# Patient Record
Sex: Male | Born: 1975 | Race: White | Hispanic: No | Marital: Married | Smoking: Never smoker
Health system: Southern US, Community
[De-identification: ages and names within clinical notes are randomized; demographics above are authoritative.]

## PROBLEM LIST (undated history)

## (undated) DIAGNOSIS — T7840XA Allergy, unspecified, initial encounter: Secondary | ICD-10-CM

## (undated) HISTORY — DX: Allergy, unspecified, initial encounter: T78.40XA

---

## 2013-05-16 ENCOUNTER — Encounter: Payer: Self-pay | Admitting: Family Medicine

## 2013-05-16 ENCOUNTER — Ambulatory Visit (INDEPENDENT_AMBULATORY_CARE_PROVIDER_SITE_OTHER): Payer: PRIVATE HEALTH INSURANCE | Admitting: Family Medicine

## 2013-05-16 VITALS — BP 123/70 | Temp 98.6°F | Wt 166.0 lb

## 2013-05-16 DIAGNOSIS — J019 Acute sinusitis, unspecified: Secondary | ICD-10-CM

## 2013-05-16 MED ORDER — AZITHROMYCIN 250 MG PO TABS: ORAL_TABLET | ORAL | Status: DC

## 2013-05-16 NOTE — Progress Notes (Signed)
  Subjective:    Patient ID: Christopher Riggs, male    DOB: 10/24/1976, 37 y.o.   MRN: [id number]  HPI Patient went in with head congestion drainage coughing bringing up yellow phlegm denies high fever chills relates not feeling well no wheezing or difficulty breathing. PMH benign does not smoke.   Review of Systems Denies wheezing difficulty breathing chest pressure pain discomfort passing out. Denies rashes.    Objective:   Physical Exam TMs and L., T.-NL, neck is supple lungs are clear no crackles heart is regular pulse normal       Assessment & Plan:  Sinusitis-Zithromax as directed warning signs were discussed call us if progressive troubles

## 2013-05-16 NOTE — Patient Instructions (Signed)
Store brand Allegra, fexofenadine 180 mg one daily

## 2015-02-08 ENCOUNTER — Ambulatory Visit (INDEPENDENT_AMBULATORY_CARE_PROVIDER_SITE_OTHER): Payer: PRIVATE HEALTH INSURANCE | Admitting: Family Medicine

## 2015-02-08 ENCOUNTER — Encounter: Payer: Self-pay | Admitting: Family Medicine

## 2015-02-08 VITALS — BP 126/88 | Ht 69.0 in | Wt 169.0 lb

## 2015-02-08 DIAGNOSIS — M545 Low back pain, unspecified: Secondary | ICD-10-CM

## 2015-02-08 MED ORDER — ETODOLAC 400 MG PO TABS: 400.0000 mg | ORAL_TABLET | Freq: Two times a day (BID) | ORAL | Status: DC

## 2015-02-08 MED ORDER — CHLORZOXAZONE 500 MG PO TABS: 500.0000 mg | ORAL_TABLET | Freq: Four times a day (QID) | ORAL | Status: DC | PRN

## 2015-02-08 NOTE — Progress Notes (Signed)
   Subjective:    Patient ID: Christopher GerlachChristopher Riggs, male    DOB: 10-22-1976, 39 y.o.   MRN: [id number]  HPI Comments: Believes he pulled a muscle over the weekend when playing with his children.  Back Pain This is a new problem. The current episode started in the past 7 days. The problem occurs intermittently. The problem has been gradually worsening since onset. Pain location: left lower back. The pain is the same all the time. The symptoms are aggravated by coughing, bending, lying down and twisting. Stiffness is present in the morning. (Muscle spasms ) He has tried NSAIDs for the symptoms. The treatment provided mild relief.    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   No urinary symptoms no leg pain no chest pain    Objective:   Physical Exam  Alert no acute distress lungs clear heart rare rhythm left lumbar region tender to deep palpation negative straight leg raise spine nontender      Assessment & Plan:  Impression lumbar strain/spasm discussed plan anti-inflammatory medicine prescribed. Muscle spasm medicine prescribed symptomatically care discussed

## 2015-11-16 ENCOUNTER — Ambulatory Visit (INDEPENDENT_AMBULATORY_CARE_PROVIDER_SITE_OTHER): Payer: PRIVATE HEALTH INSURANCE | Admitting: Family Medicine

## 2015-11-16 ENCOUNTER — Encounter: Payer: Self-pay | Admitting: Family Medicine

## 2015-11-16 VITALS — Temp 98.1°F | Ht 69.0 in | Wt 165.0 lb

## 2015-11-16 DIAGNOSIS — J019 Acute sinusitis, unspecified: Secondary | ICD-10-CM

## 2015-11-16 DIAGNOSIS — K219 Gastro-esophageal reflux disease without esophagitis: Secondary | ICD-10-CM | POA: Insufficient documentation

## 2015-11-16 DIAGNOSIS — B9689 Other specified bacterial agents as the cause of diseases classified elsewhere: Secondary | ICD-10-CM

## 2015-11-16 MED ORDER — CEFPROZIL 250 MG/5ML PO SUSR: ORAL | Status: DC

## 2015-11-16 NOTE — Progress Notes (Signed)
   Subjective:    Patient ID: Christopher GerlachChristopher Riggs, male    DOB: [birth year], 39 y.o.   MRN: [id number]  Cough This is a new problem. The current episode started 1 to 4 weeks ago. Associated symptoms include headaches, nasal congestion, rhinorrhea and a sore throat. Pertinent negatives include no chest pain, ear pain, fever or wheezing. Associated symptoms comments: Sinus pressure. He has tried OTC cough suppressant (delysm, nyquil) for the symptoms.   Patient relates head congestion sinus pressure states he does better with swallowing liquids cause occasionally pills get stuck denies food getting stuck  Review of Systems  Constitutional: Negative for fever and activity change.  HENT: Positive for congestion, rhinorrhea and sore throat. Negative for ear pain.   Eyes: Negative for discharge.  Respiratory: Positive for cough. Negative for wheezing.   Cardiovascular: Negative for chest pain.  Neurological: Positive for headaches.       Objective:   Physical Exam  Constitutional: He appears well-developed.  HENT:  Head: Normocephalic.  Mouth/Throat: Oropharynx is clear and moist. No oropharyngeal exudate.  Neck: Normal range of motion.  Cardiovascular: Normal rate, regular rhythm and normal heart sounds.   No murmur heard. Pulmonary/Chest: Effort normal and breath sounds normal. He has no wheezes.  Lymphadenopathy:    He has no cervical adenopathy.  Neurological: He exhibits normal muscle tone.  Skin: Skin is warm and dry.  Nursing note and vitals reviewed.         Assessment & Plan:  Viral syndrome Secondary rhinosinusitis Antibiotics prescribed warning signs discussed follow-up if problems  I

## 2016-06-13 ENCOUNTER — Encounter: Payer: Self-pay | Admitting: Family Medicine

## 2016-06-13 ENCOUNTER — Ambulatory Visit (INDEPENDENT_AMBULATORY_CARE_PROVIDER_SITE_OTHER): Payer: PRIVATE HEALTH INSURANCE | Admitting: Family Medicine

## 2016-06-13 VITALS — BP 118/82 | Ht 69.0 in | Wt 177.2 lb

## 2016-06-13 DIAGNOSIS — M7711 Lateral epicondylitis, right elbow: Secondary | ICD-10-CM | POA: Diagnosis not present

## 2016-06-13 NOTE — Progress Notes (Signed)
   Subjective:    Patient ID: Christopher Riggs, male    DOB: [birth year], 40 y.o.   MRN: [id number]  HPI Patient arrives with c/o pain in right arm from his elbow to his wrist. 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 Never had this problem before Review of Systems see above    Objective:   Physical Exam On physical exam shoulder wrist normal moderate tenderness in the elbow consistent with tennis elbow neurologic grossly normal pulses normal color normal       Assessment & Plan:  Tennis elbow-pathophysiology stretches treatment was discussed in detail may use OTC Naprosyn 220 mg 2 in the morning 2 in the evening along with proper icing and proper brace if not improving over the next 4 weeks notify us  Detailed discussion regarding recent lab work that the patient did overall looks good slight elevation of bad cholesterol of the eating regular physical activity recommended repeat it again in one year

## 2016-09-30 ENCOUNTER — Encounter: Payer: Self-pay | Admitting: Family Medicine

## 2016-09-30 ENCOUNTER — Ambulatory Visit (INDEPENDENT_AMBULATORY_CARE_PROVIDER_SITE_OTHER): Payer: PRIVATE HEALTH INSURANCE | Admitting: Family Medicine

## 2016-09-30 VITALS — BP 118/84 | Temp 99.3°F | Ht 69.0 in | Wt 175.0 lb

## 2016-09-30 DIAGNOSIS — B9689 Other specified bacterial agents as the cause of diseases classified elsewhere: Secondary | ICD-10-CM | POA: Diagnosis not present

## 2016-09-30 DIAGNOSIS — J4521 Mild intermittent asthma with (acute) exacerbation: Secondary | ICD-10-CM | POA: Diagnosis not present

## 2016-09-30 DIAGNOSIS — J019 Acute sinusitis, unspecified: Secondary | ICD-10-CM | POA: Diagnosis not present

## 2016-09-30 DIAGNOSIS — J683 Other acute and subacute respiratory conditions due to chemicals, gases, fumes and vapors: Secondary | ICD-10-CM

## 2016-09-30 MED ORDER — ALBUTEROL SULFATE HFA 108 (90 BASE) MCG/ACT IN AERS: 2.0000 | INHALATION_SPRAY | Freq: Four times a day (QID) | RESPIRATORY_TRACT | 2 refills | Status: DC | PRN

## 2016-09-30 MED ORDER — PREDNISONE 20 MG PO TABS: ORAL_TABLET | ORAL | 0 refills | Status: DC

## 2016-09-30 MED ORDER — AMOXICILLIN-POT CLAVULANATE 875-125 MG PO TABS: 1.0000 | ORAL_TABLET | Freq: Two times a day (BID) | ORAL | 0 refills | Status: AC

## 2016-09-30 NOTE — Progress Notes (Signed)
   Subjective:    Patient ID: Christopher Riggs, male    DOB: March 10, 1976, 40 y.o.   MRN: [id number]  Sinusitis  This is a new problem. Episode onset: 2 weeks. Associated symptoms include congestion, coughing, headaches and a sore throat. Treatments tried: mucinex d.   Some olds in the family  Non dmoker   Notes wheeziness in the chest  Usually no sig allergis    Review of Systems  HENT: Positive for congestion and sore throat.   Respiratory: Positive for cough.   Neurological: Positive for headaches.       Objective:   Physical Exam  Alert, mild malaise. Hydration good Vitals stable. frontal/ maxillary tenderness evident positive nasal congestion. pharynx normal neck supple  lungs clear/no crackles . heart regular in rhythm Positive reactive airways      Assessment & Plan:  Impression sinusitis bronchitis with element of reactive airways. Plan antibiotics. Steroids. Albuterol 2 sprays 4 times a day. Proper use of inhaler discussed

## 2016-12-23 ENCOUNTER — Ambulatory Visit (INDEPENDENT_AMBULATORY_CARE_PROVIDER_SITE_OTHER): Payer: PRIVATE HEALTH INSURANCE | Admitting: Family Medicine

## 2016-12-23 ENCOUNTER — Encounter: Payer: Self-pay | Admitting: Family Medicine

## 2016-12-23 VITALS — BP 110/80 | Temp 98.2°F | Ht 69.0 in | Wt 171.0 lb

## 2016-12-23 DIAGNOSIS — J019 Acute sinusitis, unspecified: Secondary | ICD-10-CM | POA: Diagnosis not present

## 2016-12-23 DIAGNOSIS — B349 Viral infection, unspecified: Secondary | ICD-10-CM | POA: Diagnosis not present

## 2016-12-23 DIAGNOSIS — B9689 Other specified bacterial agents as the cause of diseases classified elsewhere: Secondary | ICD-10-CM | POA: Diagnosis not present

## 2016-12-23 MED ORDER — DOXYCYCLINE HYCLATE 100 MG PO CAPS: 100.0000 mg | ORAL_CAPSULE | Freq: Two times a day (BID) | ORAL | 0 refills | Status: DC

## 2016-12-23 NOTE — Progress Notes (Signed)
   Subjective:    Patient ID: Christopher GerlachChristopher Riggs, male    DOB: [birth year], 41 y.o.   MRN: [id number]  Sinusitis  This is a new problem. The current episode started in the past 7 days. The problem is unchanged. There has been no fever. Associated symptoms include congestion, coughing and headaches. Pertinent negatives include no ear pain. Treatments tried: Mucinex DM. The treatment provided no relief.   Patient does have history of reactive airway although not severe recently Patient does not smoke Relates some head congestion drainage coughing chest congestion sinus pressure   Review of Systems  Constitutional: Negative for activity change and fever.  HENT: Positive for congestion and rhinorrhea. Negative for ear pain.   Eyes: Negative for discharge.  Respiratory: Positive for cough. Negative for wheezing.   Cardiovascular: Negative for chest pain.  Neurological: Positive for headaches.       Objective:   Physical Exam  Constitutional: He appears well-developed.  HENT:  Head: Normocephalic.  Mouth/Throat: Oropharynx is clear and moist. No oropharyngeal exudate.  Neck: Normal range of motion.  Cardiovascular: Normal rate, regular rhythm and normal heart sounds.   No murmur heard. Pulmonary/Chest: Effort normal and breath sounds normal. He has no wheezes.  Lymphadenopathy:    He has no cervical adenopathy.  Neurological: He exhibits normal muscle tone.  Skin: Skin is warm and dry.  Nursing note and vitals reviewed.         Assessment & Plan:  Viral syndrome Secondary rhinosinusitis Antibiotic prescribed warning signs discussed follow-up if ongoing troubles

## 2017-10-08 ENCOUNTER — Encounter: Payer: Self-pay | Admitting: Family Medicine

## 2017-10-08 ENCOUNTER — Ambulatory Visit (INDEPENDENT_AMBULATORY_CARE_PROVIDER_SITE_OTHER): Payer: PRIVATE HEALTH INSURANCE | Admitting: Family Medicine

## 2017-10-08 VITALS — BP 130/94 | Temp 98.8°F | Ht 69.0 in | Wt 177.0 lb

## 2017-10-08 DIAGNOSIS — B349 Viral infection, unspecified: Secondary | ICD-10-CM

## 2017-10-08 DIAGNOSIS — J189 Pneumonia, unspecified organism: Secondary | ICD-10-CM | POA: Diagnosis not present

## 2017-10-08 MED ORDER — AZITHROMYCIN 250 MG PO TABS: ORAL_TABLET | ORAL | 0 refills | Status: DC

## 2017-10-08 MED ORDER — DOXYCYCLINE HYCLATE 100 MG PO TABS: 100.0000 mg | ORAL_TABLET | Freq: Two times a day (BID) | ORAL | 0 refills | Status: DC

## 2017-10-08 NOTE — Progress Notes (Signed)
   Subjective:    Patient ID: Christopher Riggs, male    DOB: 02/27/1976, 41 y.o.   MRN: [id number]  Cough  This is a new problem. The current episode started 1 to 4 weeks ago. The problem has been gradually worsening. The cough is productive of sputum. Associated symptoms include headaches, rhinorrhea and a sore throat. Pertinent negatives include no chest pain, ear pain, fever or wheezing. Treatments tried: Mucinex DM    Patient states no other concerns this visit.  PMH benign he states it started over the past couple weeks a lot of congestion drainage coughing  Review of Systems  Constitutional: Negative for activity change and fever.  HENT: Positive for congestion, rhinorrhea and sore throat. Negative for ear pain.   Eyes: Negative for discharge.  Respiratory: Positive for cough. Negative for wheezing.   Cardiovascular: Negative for chest pain.  Neurological: Positive for headaches.       Objective:   Physical Exam  Constitutional: He appears well-developed.  HENT:  Head: Normocephalic.  Mouth/Throat: Oropharynx is clear and moist. No oropharyngeal exudate.  Neck: Normal range of motion.  Cardiovascular: Normal rate, regular rhythm and normal heart sounds.   No murmur heard. Pulmonary/Chest: Effort normal. He has no wheezes.  He has significant congestion noted in both lungs more at the bases not in respiratory distress  Lymphadenopathy:    He has no cervical adenopathy.  Neurological: He exhibits normal muscle tone.  Skin: Skin is warm and dry.  Nursing note and vitals reviewed.         Assessment & Plan:  Viral syndrome Walking pneumonia X-ray lab work not indicated Warning signs were discussed in detail Secondary rhinosinusitis Antibiotics prescribed warning signs discussed follow-up of ongoing troubles

## 2019-09-06 ENCOUNTER — Telehealth: Payer: Self-pay | Admitting: Family Medicine

## 2019-09-06 DIAGNOSIS — Z0000 Encounter for general adult medical examination without abnormal findings: Secondary | ICD-10-CM

## 2019-09-06 NOTE — Telephone Encounter (Signed)
Lipid, liver, met 7, CBC

## 2019-09-06 NOTE — Telephone Encounter (Signed)
No lab orders completed from our office in 3 years. Please advise. Thank you

## 2019-09-06 NOTE — Telephone Encounter (Signed)
Orders put in and pt notified.  

## 2019-09-06 NOTE — Telephone Encounter (Signed)
Scheduled for a physical in October and would like orders put in for labs done at [facility] to leave a msg when lab orders are in.

## 2019-10-05 ENCOUNTER — Encounter: Payer: PRIVATE HEALTH INSURANCE | Admitting: Family Medicine

## 2019-11-01 ENCOUNTER — Encounter: Payer: Self-pay | Admitting: Family Medicine

## 2019-11-01 ENCOUNTER — Other Ambulatory Visit: Payer: Self-pay

## 2019-11-01 ENCOUNTER — Ambulatory Visit (INDEPENDENT_AMBULATORY_CARE_PROVIDER_SITE_OTHER): Payer: PRIVATE HEALTH INSURANCE | Admitting: Family Medicine

## 2019-11-01 VITALS — BP 124/84 | Temp 97.8°F | Ht 67.5 in | Wt 166.8 lb

## 2019-11-01 DIAGNOSIS — Z0000 Encounter for general adult medical examination without abnormal findings: Secondary | ICD-10-CM

## 2019-11-01 NOTE — Progress Notes (Signed)
   Subjective:    Patient ID: Lemoyne Nestor, male    DOB: August 21, 1976, 43 y.o.   MRN: [id number]  HPI The patient comes in today for a wellness visit.  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 states overall energy level doing well trying to be healthy with his eating habits Patient does not smoke or drink patient denies being depressed A review of their health history was completed.  A review of medications was also completed.  Any needed refills; not on any meds  Eating habits: health conscious  Falls/  MVA accidents in past few months: none  Regular exercise: not really  Specialist pt sees on regular basis: none  Preventative health issues were discussed.   Additional concerns: none   Review of Systems  Constitutional: Negative for activity change, appetite change and fever.  HENT: Negative for congestion and rhinorrhea.   Eyes: Negative for discharge.  Respiratory: Negative for cough and wheezing.   Cardiovascular: Negative for chest pain.  Gastrointestinal: Negative for abdominal pain, blood in stool and vomiting.  Genitourinary: Negative for difficulty urinating and frequency.  Musculoskeletal: Negative for neck pain.  Skin: Negative for rash.  Allergic/Immunologic: Negative for environmental allergies and food allergies.  Neurological: Negative for weakness and headaches.  Psychiatric/Behavioral: Negative for agitation.       Objective:   Physical Exam Constitutional:      Appearance: He is well-developed.  HENT:     Head: Normocephalic and atraumatic.     Right Ear: External ear normal.     Left Ear: External ear normal.     Nose: Nose normal.  Eyes:     Pupils: Pupils are equal, round, and reactive to light.  Neck:     Musculoskeletal: Normal range of motion and neck supple.     Thyroid: No thyromegaly.  Cardiovascular:     Rate and Rhythm: Normal rate and regular rhythm.     Heart  sounds: Normal heart sounds. No murmur.  Pulmonary:     Effort: Pulmonary effort is normal. No respiratory distress.     Breath sounds: Normal breath sounds. No wheezing.  Abdominal:     General: Bowel sounds are normal. There is no distension.     Palpations: Abdomen is soft. There is no mass.     Tenderness: There is no abdominal tenderness.  Genitourinary:    Penis: Normal.   Musculoskeletal: Normal range of motion.  Lymphadenopathy:     Cervical: No cervical adenopathy.  Skin:    General: Skin is warm and dry.     Findings: No erythema.  Neurological:     Mental Status: He is alert.     Motor: No abnormal muscle tone.  Psychiatric:        Behavior: Behavior normal.        Judgment: Judgment normal.           Assessment & Plan:  Adult wellness-complete.wellness physical was conducted today. Importance of diet and exercise were discussed in detail.  In addition to this a discussion regarding safety was also covered. We also reviewed over immunizations and gave recommendations regarding current immunization needed for age.  In addition to this additional areas were also touched on including: Preventative health exams needed:  Colonoscopy not indicated He will do lab work later today follow-up if any ongoing troubles otherwise wellness yearly Patient declines flu shot  Patient was advised yearly wellness exam

## 2019-11-02 LAB — LIPID PANEL
Chol/HDL Ratio: 4.7 ratio (ref 0.0–5.0)
Cholesterol, Total: 208 mg/dL — ABNORMAL HIGH (ref 100–199)
HDL: 44 mg/dL (ref >39)
LDL Chol Calc (NIH): 135 mg/dL — ABNORMAL HIGH (ref 0–99)
Triglycerides: 161 mg/dL — ABNORMAL HIGH (ref 0–149)
VLDL Cholesterol Cal: 29 mg/dL (ref 5–40)

## 2019-11-02 LAB — BASIC METABOLIC PANEL
BUN/Creatinine Ratio: 9 (ref 9–20)
BUN: 9 mg/dL (ref 6–24)
CO2: 24 mmol/L (ref 20–29)
Calcium: 9.9 mg/dL (ref 8.7–10.2)
Chloride: 100 mmol/L (ref 96–106)
Creatinine, Ser: 1.04 mg/dL (ref 0.76–1.27)
GFR calc Af Amer: 103 mL/min/{1.73_m2} (ref >59)
GFR calc non Af Amer: 89 mL/min/{1.73_m2} (ref >59)
Glucose: 83 mg/dL (ref 65–99)
Potassium: 4.2 mmol/L (ref 3.5–5.2)
Sodium: 139 mmol/L (ref 134–144)

## 2019-11-02 LAB — CBC WITH DIFFERENTIAL/PLATELET
Basophils Absolute: 0.1 10*3/uL (ref 0.0–0.2)
Basos: 1 %
EOS (ABSOLUTE): 0.1 10*3/uL (ref 0.0–0.4)
Eos: 1 %
Hematocrit: 49.2 % (ref 37.5–51.0)
Hemoglobin: 17 g/dL (ref 13.0–17.7)
Immature Grans (Abs): 0 10*3/uL (ref 0.0–0.1)
Immature Granulocytes: 1 %
Lymphocytes Absolute: 3.4 10*3/uL — ABNORMAL HIGH (ref 0.7–3.1)
Lymphs: 39 %
MCH: 30.3 pg (ref 26.6–33.0)
MCHC: 34.6 g/dL (ref 31.5–35.7)
MCV: 88 fL (ref 79–97)
Monocytes Absolute: 0.7 10*3/uL (ref 0.1–0.9)
Monocytes: 8 %
Neutrophils Absolute: 4.6 10*3/uL (ref 1.4–7.0)
Neutrophils: 50 %
Platelets: 329 10*3/uL (ref 150–450)
RBC: 5.61 x10E6/uL (ref 4.14–5.80)
RDW: 13.7 % (ref 11.6–15.4)
WBC: 8.8 10*3/uL (ref 3.4–10.8)

## 2019-11-02 LAB — HEPATIC FUNCTION PANEL
ALT: 26 IU/L (ref 0–44)
AST: 23 IU/L (ref 0–40)
Albumin: 4.9 g/dL (ref 4.0–5.0)
Alkaline Phosphatase: 80 IU/L (ref 39–117)
Bilirubin Total: 0.6 mg/dL (ref 0.0–1.2)
Bilirubin, Direct: 0.15 mg/dL (ref 0.00–0.40)
Total Protein: 7.6 g/dL (ref 6.0–8.5)

## 2020-09-03 ENCOUNTER — Telehealth (INDEPENDENT_AMBULATORY_CARE_PROVIDER_SITE_OTHER): Payer: No Typology Code available for payment source | Admitting: Family Medicine

## 2020-09-03 ENCOUNTER — Other Ambulatory Visit: Payer: Self-pay

## 2020-09-03 ENCOUNTER — Ambulatory Visit (HOSPITAL_COMMUNITY)
Admission: RE | Admit: 2020-09-03 | Discharge: 2020-09-03 | Disposition: A | Discharge status: Home / Self Care | Payer: No Typology Code available for payment source | Source: Ambulatory Visit | Attending: Family Medicine | Admitting: Family Medicine

## 2020-09-03 VITALS — Ht 69.0 in | Wt 155.0 lb

## 2020-09-03 DIAGNOSIS — R05 Cough: Secondary | ICD-10-CM | POA: Insufficient documentation

## 2020-09-03 DIAGNOSIS — R059 Cough, unspecified: Secondary | ICD-10-CM

## 2020-09-03 DIAGNOSIS — U071 COVID-19: Secondary | ICD-10-CM | POA: Diagnosis not present

## 2020-09-03 NOTE — Progress Notes (Signed)
   Subjective:    Patient ID: Christopher Riggs, male    DOB: [birth year], 44 y.o.   MRN: [id number]  HPIcovid test positve last Tuesday per pt. Symptoms started Monday before taking test. Started as fatigue and high fever on the first day. Then fatigue and sinus pressure for several days, then 3 days ago started running high temp again. Having a mild cough, diarrhea and sinus pressure now. Taking mucinex and tylenol.   Virtual Visit via Video Note  I connected with Christopher Riggs on 09/03/20 at 11:30 AM EDT by a video enabled telemedicine application and verified that I am speaking with the correct person using two identifiers.  Location: Patient: home   Provider: office   I discussed the limitations of evaluation and management by telemedicine and the availability of in person appointments. The patient expressed understanding and agreed to proceed.  History of Present Illness:    Observations/Objective:   Assessment and Plan:   Follow Up Instructions:    I discussed the assessment and treatment plan with the patient. The patient was provided an opportunity to ask questions and all were answered. The patient agreed with the plan and demonstrated an understanding of the instructions.   The patient was advised to call back or seek an in-person evaluation if the symptoms worsen or if the condition fails to improve as anticipated.  I provided 20 minutes of non-face-to-face time during this encounter.   After discussion on the phone it was determined that the patient needed to be seen in person and was brought by the office lungs were clear respiratory rate normal no respiratory distress    Review of Systems     Objective:   Physical Exam  Please see above No respiratory distress O2 saturation good     Assessment & Plan:  Chest x-ray ordered Positive Covid Should do well but warning signs were discussed in detail If severe shortness of breath or other issues  immediately go to ER/urgent care Call us if any questions

## 2020-09-07 ENCOUNTER — Ambulatory Visit: Payer: No Typology Code available for payment source | Admitting: Family Medicine

## 2020-09-07 ENCOUNTER — Other Ambulatory Visit: Payer: Self-pay

## 2020-09-07 ENCOUNTER — Ambulatory Visit (INDEPENDENT_AMBULATORY_CARE_PROVIDER_SITE_OTHER): Payer: No Typology Code available for payment source | Admitting: Family Medicine

## 2020-09-07 VITALS — HR 96 | Temp 98.7°F | Resp 16

## 2020-09-07 DIAGNOSIS — U071 COVID-19: Secondary | ICD-10-CM | POA: Diagnosis not present

## 2020-09-07 NOTE — Progress Notes (Signed)
Patient ID: Christopher Riggs, male    DOB: 07/08/1976, 44 y.o.   MRN: [id number]   Chief Complaint  Patient presents with   Covid Positive   Subjective:    HPI Pt with covid testing positive. Seen 1 wk ago and supposed to f/u on Wednesday and missed appt. Having high fevers, and going to 100-101F. Then has kep going to 105 quickly and persisting for several days.  At noon yesterday got high, when had temp at 100F then kept it high. Taking ibuprofen earlier than letting it so high.   Today feeling much better, sleep better.  Small cough. Small amt nausea and vomiting.  High fever vomited.  Had some diarrhea, but got better.  None in last 2 days. otc meds- vit C, vit d, zinc,  No cough syrup.  Tylenol/ibuprofen.    Medical History Christopher Riggs has a past medical history of Allergy.   Outpatient Encounter Medications as of 09/07/2020  Medication Sig   albuterol (VENTOLIN HFA) 108 (90 Base) MCG/ACT inhaler Inhale 2 puffs into the lungs every 6 (six) hours as needed for wheezing or shortness of breath.   No facility-administered encounter medications on file as of 09/07/2020.     Review of Systems  Constitutional: Positive for fever. Negative for chills.  HENT: Negative for congestion, ear pain, rhinorrhea, sinus pressure, sinus pain, sneezing and sore throat.   Eyes: Negative for pain, discharge and itching.  Respiratory: Positive for cough.   Gastrointestinal: Positive for diarrhea (resolved), nausea and vomiting (when having high fever, resolved).  Skin: Negative for rash.  Neurological: Negative for headaches.     Vitals Pulse 96    Temp 98.7 F (37.1 C) (Temporal)    Resp 16    SpO2 98%   Objective:   Physical Exam Vitals and nursing note reviewed.  Constitutional:      General: He is not in acute distress.    Appearance: Normal appearance. He is not ill-appearing.  HENT:     Head: Normocephalic.     Nose: Nose normal. No congestion.     Mouth/Throat:       Mouth: Mucous membranes are moist.     Pharynx: No oropharyngeal exudate.  Eyes:     Extraocular Movements: Extraocular movements intact.     Conjunctiva/sclera: Conjunctivae normal.     Pupils: Pupils are equal, round, and reactive to light.  Cardiovascular:     Rate and Rhythm: Normal rate and regular rhythm.     Pulses: Normal pulses.     Heart sounds: Normal heart sounds. No murmur heard.   Pulmonary:     Effort: Pulmonary effort is normal.     Breath sounds: Normal breath sounds. No wheezing, rhonchi or rales.  Musculoskeletal:        General: Normal range of motion.     Right lower leg: No edema.     Left lower leg: No edema.  Skin:    General: Skin is warm and dry.     Findings: No rash.  Neurological:     General: No focal deficit present.     Mental Status: He is alert and oriented to person, place, and time.     Cranial Nerves: No cranial nerve deficit.  Psychiatric:        Mood and Affect: Mood normal.        Behavior: Behavior normal.        Thought Content: Thought content normal.        Judgment: Judgment  normal.      Assessment and Plan   1. COVID-19 virus infection - albuterol (VENTOLIN HFA) 108 (90 Base) MCG/ACT inhaler; Inhale 2 puffs into the lungs every 6 (six) hours as needed for wheezing or shortness of breath.  Dispense: 18 g; Refill: 0    Feeling much better today, taking ibuprofen now and helping with his fever.  Staying hydrated.  Cont vit d, c, and zinc. increase fluids, otc cough syrup as needed.  Gave inhaler to use prn for coughing. Call or rto if worsening fevers, coughing, or short of breath.  F/u prn.

## 2020-09-09 ENCOUNTER — Encounter: Payer: Self-pay | Admitting: Family Medicine

## 2020-09-10 ENCOUNTER — Telehealth: Payer: Self-pay | Admitting: *Deleted

## 2020-09-10 ENCOUNTER — Encounter: Payer: Self-pay | Admitting: Family Medicine

## 2020-09-10 MED ORDER — AMOXICILLIN-POT CLAVULANATE 875-125 MG PO TABS: 1.0000 | ORAL_TABLET | Freq: Two times a day (BID) | ORAL | 0 refills | Status: AC

## 2020-09-10 MED ORDER — ALBUTEROL SULFATE HFA 108 (90 BASE) MCG/ACT IN AERS: 2.0000 | INHALATION_SPRAY | Freq: Four times a day (QID) | RESPIRATORY_TRACT | 0 refills | Status: DC | PRN

## 2020-09-10 NOTE — Telephone Encounter (Signed)
Patient notified

## 2020-09-10 NOTE — Telephone Encounter (Signed)
Augmentin 875 mg, 1 taken twice daily, 7 days worth-#14-if any ongoing troubles please follow-up

## 2020-09-10 NOTE — Telephone Encounter (Signed)
Prescription sent electronically to pharmacy  Left message to return call 

## 2020-09-10 NOTE — Telephone Encounter (Signed)
Patient diagnosed with Covid and was seen by Dr Ladona Ridgel on Friday 09/07/20 because he still was feeling pretty bad and was told to do symptomatic care(they were not happy with this advise and him not getting antibiotic) They had the ability to get some leftover Augmentin and he started that on Friday after they left and he is feeling much better today but is going to be out of medication today and would like a prescription to make sure he can take a full course.

## 2020-09-27 ENCOUNTER — Telehealth: Payer: Self-pay

## 2020-09-27 DIAGNOSIS — Z0000 Encounter for general adult medical examination without abnormal findings: Secondary | ICD-10-CM

## 2020-09-27 DIAGNOSIS — Z79899 Other long term (current) drug therapy: Secondary | ICD-10-CM

## 2020-09-27 DIAGNOSIS — Z13220 Encounter for screening for lipoid disorders: Secondary | ICD-10-CM

## 2020-09-27 NOTE — Telephone Encounter (Signed)
Patient has physical in December and needing labs done 

## 2020-09-27 NOTE — Telephone Encounter (Signed)
Last labs completed 11/01/2019 CBC, BMET, HEPATIC and LIPID. Please advise. Thank you

## 2020-09-30 ENCOUNTER — Other Ambulatory Visit: Payer: Self-pay | Admitting: Family Medicine

## 2020-09-30 NOTE — Telephone Encounter (Signed)
Unless he is having any specific problems At this time I would recommend Lipid, liver, metabolic 7

## 2020-10-01 NOTE — Telephone Encounter (Signed)
Lab orders placed and pt is aware 

## 2020-11-13 ENCOUNTER — Other Ambulatory Visit: Payer: Self-pay | Admitting: Family Medicine

## 2020-11-13 MED ORDER — PERMETHRIN 5 % EX CREA: TOPICAL_CREAM | CUTANEOUS | 1 refills | Status: DC

## 2020-11-20 ENCOUNTER — Encounter: Payer: Self-pay | Admitting: Family Medicine

## 2020-11-20 ENCOUNTER — Ambulatory Visit (INDEPENDENT_AMBULATORY_CARE_PROVIDER_SITE_OTHER): Payer: No Typology Code available for payment source | Admitting: Family Medicine

## 2020-11-20 ENCOUNTER — Other Ambulatory Visit: Payer: Self-pay

## 2020-11-20 VITALS — BP 122/82 | HR 87 | Temp 97.8°F | Ht 67.5 in | Wt 167.6 lb

## 2020-11-20 DIAGNOSIS — Z0000 Encounter for general adult medical examination without abnormal findings: Secondary | ICD-10-CM

## 2020-11-20 NOTE — Progress Notes (Signed)
   Subjective:    Patient ID: Christopher Riggs, male    DOB: 15-Aug-1976, 44 y.o.   MRN: [id number]  HPI  The patient comes in today for a wellness visit.  Patient for wellness Does not smoke does not drink Tries to eat healthy Stays physically active and exercises on a regular basis Keeps good mental health denies being depressed No falls no injuries  A review of their health history was completed.  A review of medications was also completed.  Any needed refills; none  Eating habits: eating good  Falls/  MVA accidents in past few months: none  Regular exercise: yes- 3 times a week  Specialist pt sees on regular basis: none  Preventative health issues were discussed.   Additional concerns: none   Review of Systems  Constitutional: Negative for activity change, appetite change and fever.  HENT: Negative for congestion and rhinorrhea.   Eyes: Negative for discharge.  Respiratory: Negative for cough and wheezing.   Cardiovascular: Negative for chest pain.  Gastrointestinal: Negative for abdominal pain, blood in stool and vomiting.  Genitourinary: Negative for difficulty urinating and frequency.  Musculoskeletal: Negative for neck pain.  Skin: Negative for rash.  Allergic/Immunologic: Negative for environmental allergies and food allergies.  Neurological: Negative for weakness and headaches.  Psychiatric/Behavioral: Negative for agitation.       Objective:   Physical Exam Constitutional:      Appearance: He is well-developed.  HENT:     Head: Normocephalic and atraumatic.     Right Ear: External ear normal.     Left Ear: External ear normal.     Nose: Nose normal.  Eyes:     Pupils: Pupils are equal, round, and reactive to light.  Neck:     Thyroid: No thyromegaly.  Cardiovascular:     Rate and Rhythm: Normal rate and regular rhythm.     Heart sounds: Normal heart sounds. No murmur heard.   Pulmonary:     Effort: Pulmonary effort is normal. No  respiratory distress.     Breath sounds: Normal breath sounds. No wheezing.  Abdominal:     General: Bowel sounds are normal. There is no distension.     Palpations: Abdomen is soft. There is no mass.     Tenderness: There is no abdominal tenderness.  Genitourinary:    Penis: Normal.   Musculoskeletal:        General: Normal range of motion.     Cervical back: Normal range of motion and neck supple.  Lymphadenopathy:     Cervical: No cervical adenopathy.  Skin:    General: Skin is warm and dry.     Findings: No erythema.  Neurological:     Mental Status: He is alert.     Motor: No abnormal muscle tone.  Psychiatric:        Behavior: Behavior normal.        Judgment: Judgment normal.      Prostate exam not indicated     Assessment & Plan:  1. Routine general medical examination at a health care facility Adult wellness-complete.wellness physical was conducted today. Importance of diet and exercise were discussed in detail.  In addition to this a discussion regarding safety was also covered. We also reviewed over immunizations and gave recommendations regarding current immunization needed for age.  In addition to this additional areas were also touched on including: Preventative health exams needed:  Colonoscopy not indicated currently  Patient was advised yearly wellness exam

## 2020-11-21 ENCOUNTER — Encounter: Payer: Self-pay | Admitting: Family Medicine

## 2020-11-21 LAB — HEPATIC FUNCTION PANEL
ALT: 24 IU/L (ref 0–44)
AST: 20 IU/L (ref 0–40)
Albumin: 4.5 g/dL (ref 4.0–5.0)
Alkaline Phosphatase: 78 IU/L (ref 44–121)
Bilirubin Total: 0.3 mg/dL (ref 0.0–1.2)
Bilirubin, Direct: 0.11 mg/dL (ref 0.00–0.40)
Total Protein: 7.1 g/dL (ref 6.0–8.5)

## 2020-11-21 LAB — BASIC METABOLIC PANEL
BUN/Creatinine Ratio: 11 (ref 9–20)
BUN: 9 mg/dL (ref 6–24)
CO2: 23 mmol/L (ref 20–29)
Calcium: 9.5 mg/dL (ref 8.7–10.2)
Chloride: 102 mmol/L (ref 96–106)
Creatinine, Ser: 0.85 mg/dL (ref 0.76–1.27)
GFR calc Af Amer: 124 mL/min/{1.73_m2} (ref >59)
GFR calc non Af Amer: 107 mL/min/{1.73_m2} (ref >59)
Glucose: 82 mg/dL (ref 65–99)
Potassium: 4.2 mmol/L (ref 3.5–5.2)
Sodium: 140 mmol/L (ref 134–144)

## 2020-11-21 LAB — LIPID PANEL
Chol/HDL Ratio: 5.7 ratio — ABNORMAL HIGH (ref 0.0–5.0)
Cholesterol, Total: 210 mg/dL — ABNORMAL HIGH (ref 100–199)
HDL: 37 mg/dL — ABNORMAL LOW (ref >39)
LDL Chol Calc (NIH): 115 mg/dL — ABNORMAL HIGH (ref 0–99)
Triglycerides: 334 mg/dL — ABNORMAL HIGH (ref 0–149)
VLDL Cholesterol Cal: 58 mg/dL — ABNORMAL HIGH (ref 5–40)

## 2020-11-28 ENCOUNTER — Other Ambulatory Visit: Payer: Self-pay | Admitting: Family Medicine

## 2021-09-19 IMAGING — DX DG CHEST 2V
2 series · 2 of 2 positions shown · non-contrast
Comparison: None.

CLINICAL DATA: Cough, [id number] positive.

EXAM:
CHEST - 2 VIEW

[chest pa]
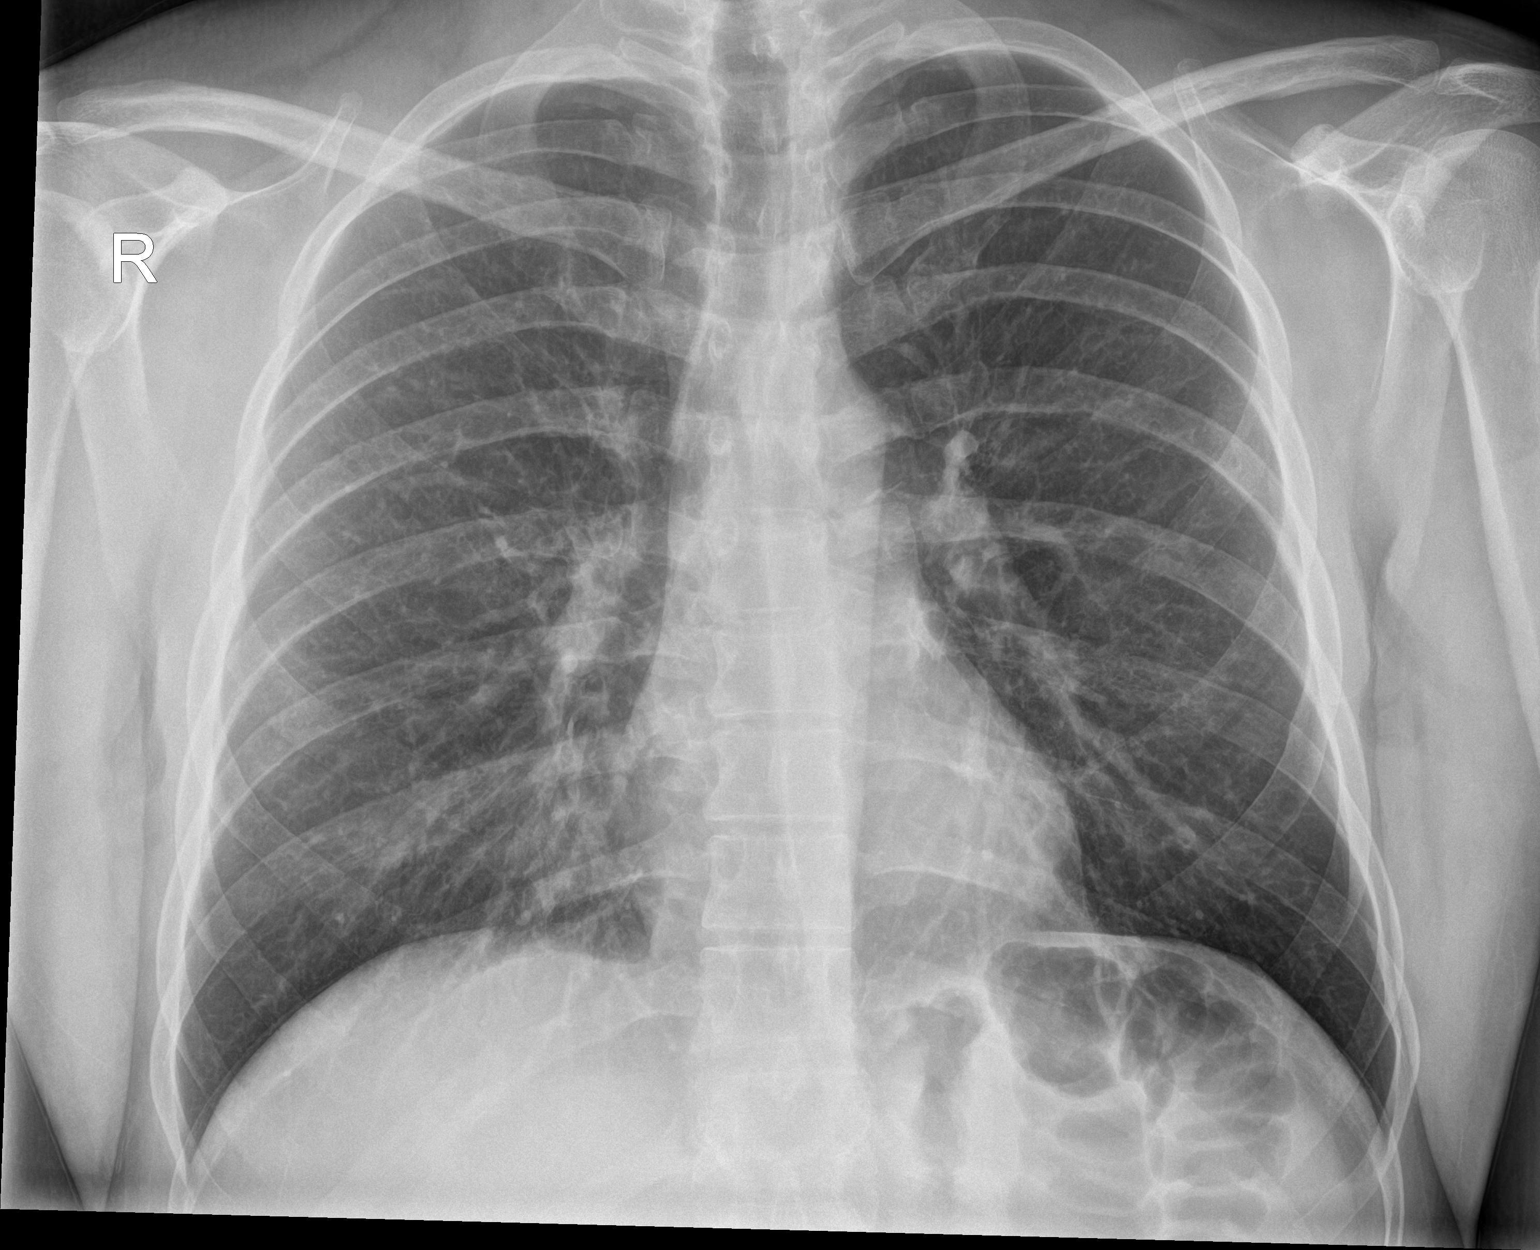

[chest lat]
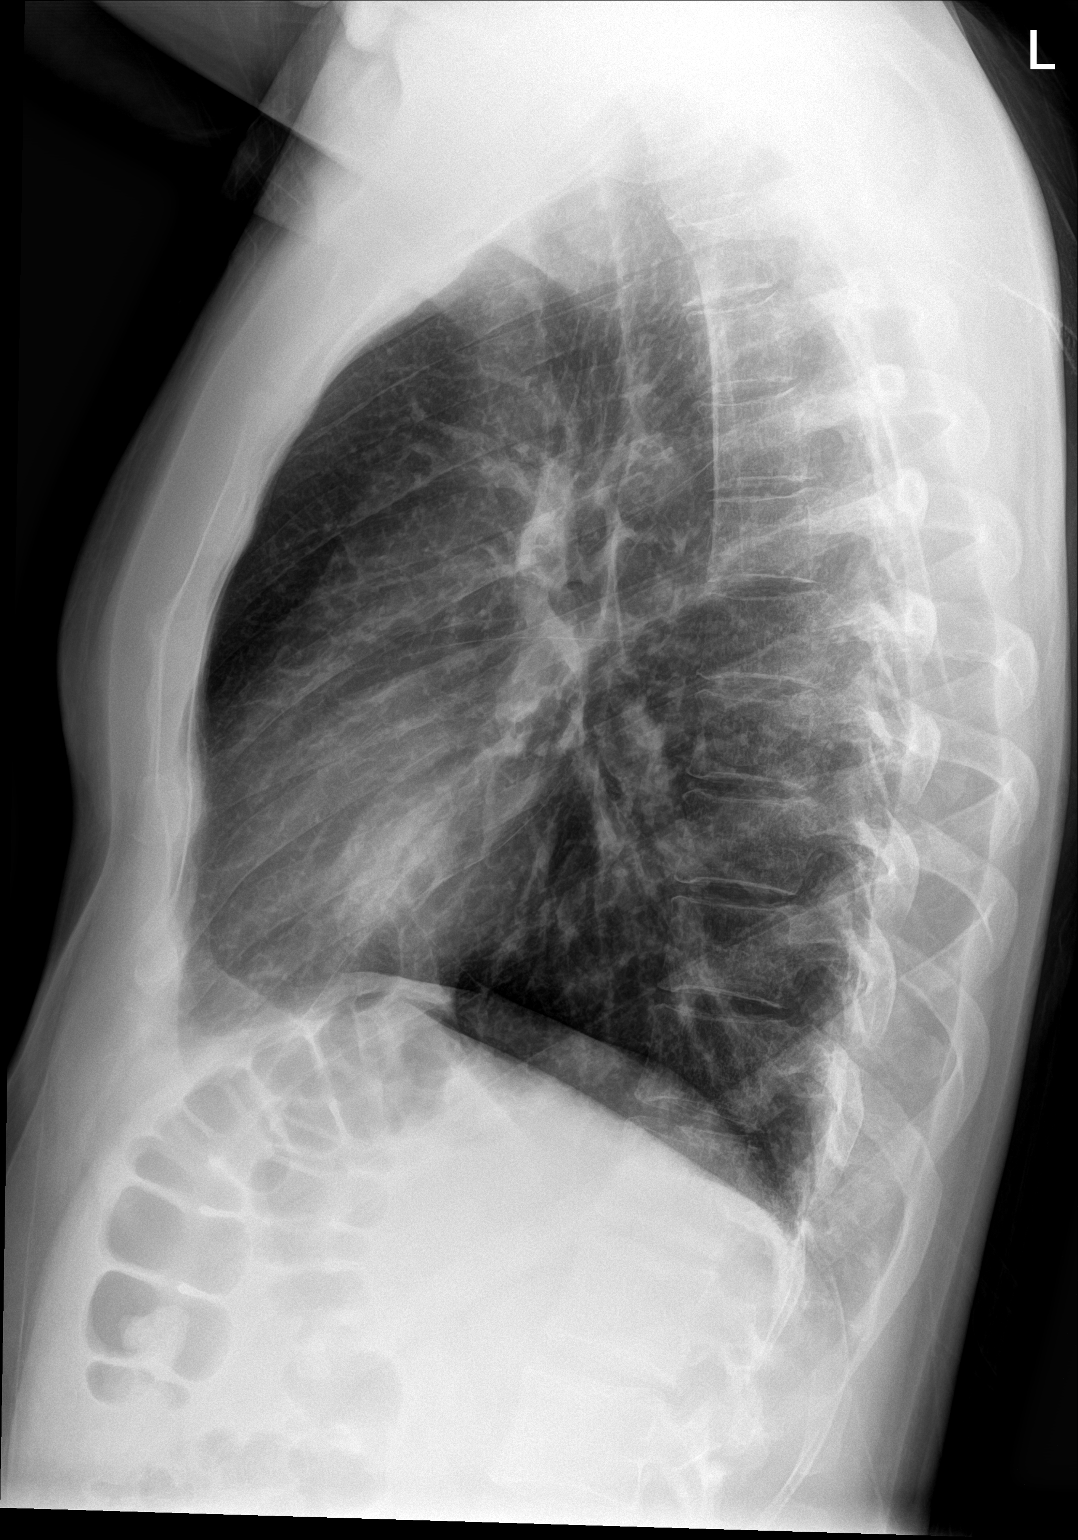

[2 of 2 positions shown; findings below may reference images not displayed]

FINDINGS: The heart size and mediastinal contours are within normal limits.
Both lungs are clear. The visualized skeletal structures are
unremarkable.
IMPRESSION: No active cardiopulmonary disease.

## 2021-10-14 ENCOUNTER — Encounter: Payer: Self-pay | Admitting: Family Medicine

## 2021-10-14 DIAGNOSIS — Z79899 Other long term (current) drug therapy: Secondary | ICD-10-CM

## 2021-10-14 DIAGNOSIS — E785 Hyperlipidemia, unspecified: Secondary | ICD-10-CM

## 2021-10-14 DIAGNOSIS — R5383 Other fatigue: Secondary | ICD-10-CM

## 2021-10-14 DIAGNOSIS — Z0000 Encounter for general adult medical examination without abnormal findings: Secondary | ICD-10-CM

## 2021-10-14 NOTE — Telephone Encounter (Signed)
Nurses Recommend lipid, liver, metabolic 7, CBC, testosterone  Diagnosis hyperlipidemia Wellness Other fatigue

## 2021-10-17 LAB — CBC WITH DIFFERENTIAL/PLATELET
Basophils Absolute: 0 10*3/uL (ref 0.0–0.2)
Basos: 1 %
EOS (ABSOLUTE): 0 10*3/uL (ref 0.0–0.4)
Eos: 0 %
Hematocrit: 48.6 % (ref 37.5–51.0)
Hemoglobin: 16.2 g/dL (ref 13.0–17.7)
Immature Grans (Abs): 0 10*3/uL (ref 0.0–0.1)
Immature Granulocytes: 0 %
Lymphocytes Absolute: 2.2 10*3/uL (ref 0.7–3.1)
Lymphs: 27 %
MCH: 29.9 pg (ref 26.6–33.0)
MCHC: 33.3 g/dL (ref 31.5–35.7)
MCV: 90 fL (ref 79–97)
Monocytes Absolute: 0.4 10*3/uL (ref 0.1–0.9)
Monocytes: 5 %
Neutrophils Absolute: 5.6 10*3/uL (ref 1.4–7.0)
Neutrophils: 67 %
Platelets: 315 10*3/uL (ref 150–450)
RBC: 5.42 x10E6/uL (ref 4.14–5.80)
RDW: 13.2 % (ref 11.6–15.4)
WBC: 8.3 10*3/uL (ref 3.4–10.8)

## 2021-10-17 LAB — BASIC METABOLIC PANEL
BUN/Creatinine Ratio: 11 (ref 9–20)
BUN: 10 mg/dL (ref 6–24)
CO2: 25 mmol/L (ref 20–29)
Calcium: 9.5 mg/dL (ref 8.7–10.2)
Chloride: 103 mmol/L (ref 96–106)
Creatinine, Ser: 0.88 mg/dL (ref 0.76–1.27)
Glucose: 110 mg/dL — ABNORMAL HIGH (ref 70–99)
Potassium: 4.1 mmol/L (ref 3.5–5.2)
Sodium: 142 mmol/L (ref 134–144)
eGFR: 109 mL/min/{1.73_m2} (ref >59)

## 2021-10-17 LAB — HEPATIC FUNCTION PANEL
ALT: 13 IU/L (ref 0–44)
AST: 12 IU/L (ref 0–40)
Albumin: 4.4 g/dL (ref 4.0–5.0)
Alkaline Phosphatase: 73 IU/L (ref 44–121)
Bilirubin Total: 0.4 mg/dL (ref 0.0–1.2)
Bilirubin, Direct: 0.12 mg/dL (ref 0.00–0.40)
Total Protein: 6.5 g/dL (ref 6.0–8.5)

## 2021-10-17 LAB — LIPID PANEL
Chol/HDL Ratio: 4.1 ratio (ref 0.0–5.0)
Cholesterol, Total: 162 mg/dL (ref 100–199)
HDL: 40 mg/dL (ref >39)
LDL Chol Calc (NIH): 93 mg/dL (ref 0–99)
Triglycerides: 165 mg/dL — ABNORMAL HIGH (ref 0–149)
VLDL Cholesterol Cal: 29 mg/dL (ref 5–40)

## 2021-10-17 LAB — TESTOSTERONE: Testosterone: 357 ng/dL (ref 264–916)

## 2021-10-23 ENCOUNTER — Other Ambulatory Visit: Payer: Self-pay

## 2021-10-23 ENCOUNTER — Ambulatory Visit (INDEPENDENT_AMBULATORY_CARE_PROVIDER_SITE_OTHER): Payer: No Typology Code available for payment source | Admitting: Family Medicine

## 2021-10-23 VITALS — BP 120/74 | Temp 97.2°F | Ht 66.5 in | Wt 154.2 lb

## 2021-10-23 DIAGNOSIS — Z0000 Encounter for general adult medical examination without abnormal findings: Secondary | ICD-10-CM

## 2021-10-23 NOTE — Patient Instructions (Signed)
Results for orders placed or performed in visit on 10/14/21  Lipid Profile  Result Value Ref Range   Cholesterol, Total 162 100 - 199 mg/dL   Triglycerides 165 (H) 0 - 149 mg/dL   HDL 40 >39 mg/dL   VLDL Cholesterol Cal 29 5 - 40 mg/dL   LDL Chol Calc (NIH) 93 0 - 99 mg/dL   Chol/HDL Ratio 4.1 0.0 - 5.0 ratio  Hepatic function panel  Result Value Ref Range   Total Protein 6.5 6.0 - 8.5 g/dL   Albumin 4.4 4.0 - 5.0 g/dL   Bilirubin Total 0.4 0.0 - 1.2 mg/dL   Bilirubin, Direct 0.12 0.00 - 0.40 mg/dL   Alkaline Phosphatase 73 44 - 121 IU/L   AST 12 0 - 40 IU/L   ALT 13 0 - 44 IU/L  Basic Metabolic Panel (BMET)  Result Value Ref Range   Glucose 110 (H) 70 - 99 mg/dL   BUN 10 6 - 24 mg/dL   Creatinine, Ser 0.88 0.76 - 1.27 mg/dL   eGFR 109 >59 mL/min/1.73   BUN/Creatinine Ratio 11 9 - 20   Sodium 142 134 - 144 mmol/L   Potassium 4.1 3.5 - 5.2 mmol/L   Chloride 103 96 - 106 mmol/L   CO2 25 20 - 29 mmol/L   Calcium 9.5 8.7 - 10.2 mg/dL  CBC with Differential  Result Value Ref Range   WBC 8.3 3.4 - 10.8 x10E3/uL   RBC 5.42 4.14 - 5.80 x10E6/uL   Hemoglobin 16.2 13.0 - 17.7 g/dL   Hematocrit 48.6 37.5 - 51.0 %   MCV 90 79 - 97 fL   MCH 29.9 26.6 - 33.0 pg   MCHC 33.3 31.5 - 35.7 g/dL   RDW 13.2 11.6 - 15.4 %   Platelets 315 150 - 450 x10E3/uL   Neutrophils 67 Not Estab. %   Lymphs 27 Not Estab. %   Monocytes 5 Not Estab. %   Eos 0 Not Estab. %   Basos 1 Not Estab. %   Neutrophils Absolute 5.6 1.4 - 7.0 x10E3/uL   Lymphocytes Absolute 2.2 0.7 - 3.1 x10E3/uL   Monocytes Absolute 0.4 0.1 - 0.9 x10E3/uL   EOS (ABSOLUTE) 0.0 0.0 - 0.4 x10E3/uL   Basophils Absolute 0.0 0.0 - 0.2 x10E3/uL   Immature Granulocytes 0 Not Estab. %   Immature Grans (Abs) 0.0 0.0 - 0.1 x10E3/uL  Testosterone  Result Value Ref Range   Testosterone 357 264 - 916 ng/dL

## 2021-10-23 NOTE — Progress Notes (Signed)
   Subjective:    Patient ID: Christopher Riggs, male    DOB: Jan 23, 1976, 45 y.o.   MRN: [id number]  HPI The patient comes in today for a wellness visit.  He is doing a good job taking care of himself and eating healthy does not smoke does not drink denies being depressed tries to be safe  A review of their health history was completed.  A review of medications was also completed.  Any needed refills; not on any meds currently   Eating habits: healthy eating   Falls/  MVA accidents in past few months: none  Regular exercise: yes  Specialist pt sees on regular basis: none  Preventative health issues were discussed.   Additional concerns: had vasectomy last week     Review of Systems     Objective:   Physical Exam General-in no acute distress Eyes-no discharge Lungs-respiratory rate normal, CTA CV-no murmurs,RRR Extremities skin warm dry no edema Neuro grossly normal Behavior normal, alert  Labs reviewed in detail.  Patient did state he ate some before get blood drawn that explains his elevated glucose otherwise cholesterol kidney function liver function look good Patient defers on flu shot     Assessment & Plan:   Adult wellness-complete.wellness physical was conducted today. Importance of diet and exercise were discussed in detail.  In addition to this a discussion regarding safety was also covered. We also reviewed over immunizations and gave recommendations regarding current immunization needed for age.  In addition to this additional areas were also touched on including: Preventative health exams needed:  Colonoscopy not indicated  Patient was advised yearly wellness exam

## 2022-01-02 ENCOUNTER — Encounter: Payer: Self-pay | Admitting: Family Medicine

## 2022-01-02 NOTE — Telephone Encounter (Signed)
Patient informed needed to be seen per Dr. Nicki Reaper. Appointment given for 9:20a tomorrow. Verbalized understanding.

## 2022-01-02 NOTE — Telephone Encounter (Signed)
I would recommend being seen.  I could work him in late this afternoon or tomorrow morning.  Hard to know if this cyst is something that will get better on its own or if it needs an antibiotic or may need referral to a specialist.  Cannot tell via telemetry/electronic message

## 2022-01-03 ENCOUNTER — Encounter: Payer: Self-pay | Admitting: Family Medicine

## 2022-01-03 ENCOUNTER — Ambulatory Visit: Payer: No Typology Code available for payment source | Admitting: Family Medicine

## 2022-01-03 ENCOUNTER — Other Ambulatory Visit: Payer: Self-pay

## 2022-01-03 VITALS — BP 117/81 | HR 72 | Temp 98.6°F | Ht 66.5 in | Wt 159.0 lb

## 2022-01-03 DIAGNOSIS — H00036 Abscess of eyelid left eye, unspecified eyelid: Secondary | ICD-10-CM

## 2022-01-03 MED ORDER — AMOXICILLIN-POT CLAVULANATE 875-125 MG PO TABS: 1.0000 | ORAL_TABLET | Freq: Two times a day (BID) | ORAL | 0 refills | Status: DC

## 2022-01-03 NOTE — Patient Instructions (Signed)
Please remember to do the warm compresses for 10 minutes at a time every 2 hours while awake  Take the antibiotic twice a day with a snack for the next 7 days  You should be much better by early next week if having troubles or problems please call me thanks-Dr. Lorin Picket

## 2022-01-03 NOTE — Progress Notes (Signed)
° °  Subjective:    Patient ID: Christopher Riggs, male    DOB: [birth year], 46 y.o.   MRN: [id number]  HPI  Stye on L eye , soreness for 24 hrs Left superior eyelid tender Present for several days Developed into what appeared to be a white bump which went away this morning but still left with tenderness No blurred vision Review of Systems     Objective:   Physical Exam Pupil normal slight erythema in the sclera but no sign of iritis Has swollen tender top eyelid Early cellulitis with probable stye       Assessment & Plan:  Early cellulitis Preorbital Warm compresses frequently Antibiotics over the next week If not dramatically better by Monday notify us If progressively worrisome or problematic to notify us

## 2022-02-10 ENCOUNTER — Encounter: Payer: Self-pay | Admitting: Family Medicine

## 2022-02-26 ENCOUNTER — Other Ambulatory Visit: Payer: Self-pay

## 2022-02-26 ENCOUNTER — Ambulatory Visit: Payer: No Typology Code available for payment source | Admitting: Family Medicine

## 2022-02-26 VITALS — BP 124/80 | HR 88 | Temp 98.4°F | Ht 66.5 in | Wt 163.4 lb

## 2022-02-26 DIAGNOSIS — R1013 Epigastric pain: Secondary | ICD-10-CM | POA: Diagnosis not present

## 2022-02-26 MED ORDER — PANTOPRAZOLE SODIUM 40 MG PO TBEC: 40.0000 mg | DELAYED_RELEASE_TABLET | Freq: Every day | ORAL | 3 refills | Status: DC

## 2022-02-26 NOTE — Progress Notes (Addendum)
? ?Subjective:  ?Patient ID: Christopher Riggs, male    DOB: 19-Oct-1976  Age: 46 y.o. MRN: [id number] ? ?CC: ?Chief Complaint  ?Patient presents with  ? stomach issues  ?  When eats a normal amount feels like he is full.  Has happened twice a week for the past month.  He said one day he ate 2 BBQ sandwiches and he felt full.  ? ? ?HPI: ? ?46 year old male presents for evaluation of the above. ? ?Patient reports that over the past month he has had intermittent difficulty with feeling full after he eats what he considers to be a smaller portion of food.  Patient reports this occurs approximately 1-2 times a week.  He states that most recently he ate 2 barbecue sandwiches and felt extremely full and bloated.  He states that this is not normal for him.  Denies weight loss.  Denies abdominal pain.  No hematochezia or melena.  He states that when this occurs he does feel like he needs to belch.  No reports of chest pain or shortness of breath.  He has no other complaints or concerns at this time. ? ? ?Social Hx   ?Social History  ? ?Socioeconomic History  ? Marital status: Married  ?  Spouse name: Not on file  ? Number of children: Not on file  ? Years of education: Not on file  ? Highest education level: Not on file  ?Occupational History  ? Not on file  ?Tobacco Use  ? Smoking status: Never  ? Smokeless tobacco: Never  ?Substance and Sexual Activity  ? Alcohol use: Not on file  ? Drug use: Not on file  ? Sexual activity: Not on file  ?Other Topics Concern  ? Not on file  ?Social History Narrative  ? Not on file  ? ?Social Determinants of Health  ? ?Financial Resource Strain: Not on file  ?Food Insecurity: Not on file  ?Transportation Needs: Not on file  ?Physical Activity: Not on file  ?Stress: Not on file  ?Social Connections: Not on file  ? ? ?Review of Systems  ?Constitutional:  Negative for appetite change and unexpected weight change.  ?Gastrointestinal:   ?     No abdominal pain.  No nausea or vomiting.  Early  satiety.  ? ? ?Objective:  ?BP 124/80   Pulse 88   Temp 98.4 ?F (36.9 ?C) (Oral)   Ht 5' 6.5" (1.689 m)   Wt 163 lb 6.4 oz (74.1 kg)   SpO2 96%   BMI 25.98 kg/m?  ? ?BP/Weight 02/26/2022 01/03/2022 10/23/2021  ?Systolic BP [id number]  ?Diastolic BP 80 81 74  ?Wt. (Lbs) 163.4 159 154.2  ?BMI 25.98 25.28 24.52  ? ? ?Physical Exam ?Vitals and nursing note reviewed.  ?Constitutional:   ?   General: He is not in acute distress. ?   Appearance: Normal appearance. He is not ill-appearing.  ?HENT:  ?   Head: Normocephalic and atraumatic.  ?Eyes:  ?   General:     ?   Right eye: No discharge.     ?   Left eye: No discharge.  ?   Conjunctiva/sclera: Conjunctivae normal.  ?Cardiovascular:  ?   Rate and Rhythm: Normal rate and regular rhythm.  ?   Heart sounds: No murmur heard. ?Pulmonary:  ?   Effort: Pulmonary effort is normal.  ?   Breath sounds: Normal breath sounds. No wheezing or rales.  ?Abdominal:  ?   General: There is no  distension.  ?   Palpations: Abdomen is soft. There is no mass.  ?   Tenderness: There is no abdominal tenderness.  ?Neurological:  ?   Mental Status: He is alert.  ?Psychiatric:     ?   Mood and Affect: Mood normal.     ?   Behavior: Behavior normal.  ? ? ?Lab Results  ?Component Value Date  ? WBC 8.3 10/16/2021  ? HGB 16.2 10/16/2021  ? HCT 48.6 10/16/2021  ? PLT 315 10/16/2021  ? GLUCOSE 110 (H) 10/16/2021  ? CHOL 162 10/16/2021  ? TRIG 165 (H) 10/16/2021  ? HDL 40 10/16/2021  ? Blossom 93 10/16/2021  ? ALT 13 10/16/2021  ? AST 12 10/16/2021  ? NA 142 10/16/2021  ? K 4.1 10/16/2021  ? CL 103 10/16/2021  ? CREATININE 0.88 10/16/2021  ? BUN 10 10/16/2021  ? CO2 25 10/16/2021  ? ? ? ?Assessment & Plan:  ? ?Problem List Items Addressed This Visit   ? ?  ? Other  ? Dyspepsia - Primary  ?  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  Patient presenting with early satiety.  Testing for H. pylori (per guideline based recommendations).  After testing sample has been given, patient can start PPI.  He is to follow-up  in approximately 1 month.  No red flags at this time. ?  ?  ? Relevant Orders  ? H. pylori antigen, stool  ? ?Meds ordered this encounter  ?Medications  ? pantoprazole (PROTONIX) 40 MG tablet  ?  Sig: Take 1 tablet (40 mg total) by mouth daily.  ?  Dispense:  30 tablet  ?  Refill:  3  ? ? ?Follow-up:  Return in about 1 month (around 03/29/2022). ? ?Thersa Salt DO ?[place] ? ?

## 2022-02-26 NOTE — Assessment & Plan Note (Signed)
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  Patient presenting with early satiety.  Testing for H. pylori (per guideline based recommendations).  After testing sample has been given, patient can start PPI.  He is to follow-up in approximately 1 month.  No red flags at this time. ?

## 2022-02-26 NOTE — Patient Instructions (Signed)
Go to the lab regarding H pylori testing. ? ?Medication as prescribed (after H pylori sample given). ? ?Follow up in 1 month. ? ?Take care ? ?Dr. Adriana Simas  ?

## 2022-03-01 LAB — H. PYLORI ANTIGEN, STOOL: H pylori Ag, Stl: NEGATIVE

## 2022-03-31 ENCOUNTER — Ambulatory Visit: Payer: No Typology Code available for payment source | Admitting: Family Medicine

## 2022-03-31 DIAGNOSIS — R1013 Epigastric pain: Secondary | ICD-10-CM

## 2022-03-31 NOTE — Assessment & Plan Note (Addendum)
Patient is doing well.  Symptoms have resolved. Continue Protonix (has refills). Plan to continue and reassess at follow up. ?

## 2022-03-31 NOTE — Patient Instructions (Signed)
Continue the protonix. ? ?Follow up with Dr. Lorin Picket in 3 months. ? ?Take care ? ?Dr. Adriana Simas  ?

## 2022-03-31 NOTE — Progress Notes (Signed)
? ?Subjective:  ?Patient ID: Christopher Riggs, male    DOB: 04-20-1976  Age: 46 y.o. MRN: [id number] ? ?CC: ?Chief Complaint  ?Patient presents with  ? Follow-up  ?  Stomach issues- doing better on protonix  ? ? ?HPI: ? ?46 year old male presents for follow-up regarding dyspepsia. ? ?Patient previously presented with early satiety.  H. pylori testing was negative.  Patient was placed on Protonix.  He states that he is doing well and his symptoms have resolved.  He is compliant with Protonix.  He states that he is back to his baseline. ? ?Patient Active Problem List  ? Diagnosis Date Noted  ? Dyspepsia 02/26/2022  ? Gastroesophageal reflux disease without esophagitis 11/16/2015  ? ? ?Social Hx   ?Social History  ? ?Socioeconomic History  ? Marital status: Married  ?  Spouse name: Not on file  ? Number of children: Not on file  ? Years of education: Not on file  ? Highest education level: Not on file  ?Occupational History  ? Not on file  ?Tobacco Use  ? Smoking status: Never  ? Smokeless tobacco: Never  ?Substance and Sexual Activity  ? Alcohol use: Not on file  ? Drug use: Not on file  ? Sexual activity: Not on file  ?Other Topics Concern  ? Not on file  ?Social History Narrative  ? Not on file  ? ?Social Determinants of Health  ? ?Financial Resource Strain: Not on file  ?Food Insecurity: Not on file  ?Transportation Needs: Not on file  ?Physical Activity: Not on file  ?Stress: Not on file  ?Social Connections: Not on file  ? ? ?Review of Systems  ?Constitutional: Negative.   ?Gastrointestinal: Negative.   ? ?Objective:  ?BP 126/84   Pulse 62   Ht 5' 6.5" (1.689 m)   Wt 160 lb 12.8 oz (72.9 kg)   SpO2 99%   BMI 25.56 kg/m?  ? ? ?  03/31/2022  ?  1:08 PM 02/26/2022  ?  1:14 PM 01/03/2022  ?  9:20 AM  ?BP/Weight  ?Systolic BP 126 124 117  ?Diastolic BP 84 80 81  ?Wt. (Lbs) 160.8 163.4 159  ?BMI 25.56 kg/m2 25.98 kg/m2 25.28 kg/m2  ? ? ?Physical Exam ?Vitals and nursing note reviewed.  ?Constitutional:   ?    General: He is not in acute distress. ?   Appearance: Normal appearance. He is not ill-appearing.  ?HENT:  ?   Head: Normocephalic and atraumatic.  ?Cardiovascular:  ?   Rate and Rhythm: Normal rate and regular rhythm.  ?Pulmonary:  ?   Effort: Pulmonary effort is normal.  ?   Breath sounds: Normal breath sounds.  ?Abdominal:  ?   General: There is no distension.  ?   Palpations: Abdomen is soft.  ?   Tenderness: There is no abdominal tenderness.  ?Neurological:  ?   Mental Status: He is alert.  ? ? ?Lab Results  ?Component Value Date  ? WBC 8.3 10/16/2021  ? HGB 16.2 10/16/2021  ? HCT 48.6 10/16/2021  ? PLT 315 10/16/2021  ? GLUCOSE 110 (H) 10/16/2021  ? CHOL 162 10/16/2021  ? TRIG 165 (H) 10/16/2021  ? HDL 40 10/16/2021  ? LDLCALC 93 10/16/2021  ? ALT 13 10/16/2021  ? AST 12 10/16/2021  ? NA 142 10/16/2021  ? K 4.1 10/16/2021  ? CL 103 10/16/2021  ? CREATININE 0.88 10/16/2021  ? BUN 10 10/16/2021  ? CO2 25 10/16/2021  ? ? ? ?Assessment &  Plan:  ? ?Problem List Items Addressed This Visit   ? ?  ? Other  ? Dyspepsia  ?  Patient is doing well.  Symptoms have resolved. Continue Protonix (has refills). Plan to continue and reassess at follow up. ? ?  ?  ? ?Follow-up:  Return in about 3 months (around 06/30/2022). ? ?Everlene Other DO ?[facility] ? ?

## 2022-10-07 ENCOUNTER — Telehealth: Payer: Self-pay | Admitting: Family Medicine

## 2022-10-07 NOTE — Telephone Encounter (Signed)
Patient has physical on 11/27 and needing labs done

## 2022-10-07 NOTE — Telephone Encounter (Signed)
Recommend lipid, liver, metabolic 7, N2T, HIV antibody, hep C antibody  Reason-wellness, fasting hyperglycemia, screening HIV, screening hep C per CDC guidelines If patient desires to have testosterone level checked this year you may add this but last year it was perfectly fine so we want recommend checking it unless he truly wants to check it

## 2022-10-07 NOTE — Telephone Encounter (Signed)
Last labs 10/2021 testosterone, cbc, bmp, liver, lipids

## 2022-10-08 ENCOUNTER — Other Ambulatory Visit: Payer: Self-pay

## 2022-10-08 DIAGNOSIS — E785 Hyperlipidemia, unspecified: Secondary | ICD-10-CM

## 2022-10-08 DIAGNOSIS — Z0000 Encounter for general adult medical examination without abnormal findings: Secondary | ICD-10-CM

## 2022-10-08 DIAGNOSIS — Z79899 Other long term (current) drug therapy: Secondary | ICD-10-CM

## 2022-10-08 NOTE — Telephone Encounter (Signed)
Patient has been made aware per lab orders. 

## 2022-11-10 ENCOUNTER — Encounter: Payer: Self-pay | Admitting: Family Medicine

## 2022-11-10 ENCOUNTER — Ambulatory Visit (INDEPENDENT_AMBULATORY_CARE_PROVIDER_SITE_OTHER): Payer: No Typology Code available for payment source | Admitting: Family Medicine

## 2022-11-10 VITALS — BP 112/68 | HR 102 | Temp 98.4°F | Ht 66.5 in | Wt 155.0 lb

## 2022-11-10 DIAGNOSIS — N529 Male erectile dysfunction, unspecified: Secondary | ICD-10-CM

## 2022-11-10 DIAGNOSIS — Z0000 Encounter for general adult medical examination without abnormal findings: Secondary | ICD-10-CM

## 2022-11-10 MED ORDER — SILDENAFIL CITRATE 20 MG PO TABS: ORAL_TABLET | ORAL | 2 refills | Status: AC

## 2022-11-10 NOTE — Progress Notes (Signed)
   Subjective:    Patient ID: Christopher Riggs, male    DOB: Jun 30, 1976, 46 y.o.   MRN: [id number]  HPI The patient comes in today for a wellness visit.  Patient does not smoke Rarely drinks Denies being depressed Stress levels are going well Sleeping well Practices safety  A review of their health history was completed.  A review of medications was also completed.  Any needed refills; none  Eating habits: good diet  Falls/  MVA accidents in past few months: no  Regular exercise: yes , walking  Specialist pt sees on regular basis: no  Preventative health issues were discussed.   Additional concerns: ED  Patient relates mild erectile dysfunction and has difficult time obtaining a firmer erection.  Sometimes it is just mildly erect other times has difficulty obtaining erection.  His relationship was going through stress but is doing much much better now.  We did discuss how this medication can help with erectile dysfunction but I would recommend starting off at a low dosage and gradually moving up.  Patient was warned about the potential for side effects including erection that could last greater than 4 hours and the need to go to emergency department if that does happen  Review of Systems     Objective:   Physical Exam General-in no acute distress Eyes-no discharge Lungs-respiratory rate normal, CTA CV-no murmurs,RRR Extremities skin warm dry no edema Neuro grossly normal Behavior normal, alert No murmurs abdomen soft no masses extremities no edema       Assessment & Plan:   Patient to do lab work Adult wellness-complete.wellness physical was conducted today. Importance of diet and exercise were discussed in detail.  Importance of stress reduction and healthy living were discussed.  In addition to this a discussion regarding safety was also covered.  We also reviewed over immunizations and gave recommendations regarding current immunization needed for age.    In addition to this additional areas were also touched on including: Preventative health exams needed:  Colonoscopy recommended at age 11 if his insurance company covers that  Patient was advised yearly wellness exam  Patient with mild erectile dysfunction Sildenafil 20 mg 1 tablet-up to 3 tablets 1 hour before relations see discussion above

## 2022-11-11 LAB — BASIC METABOLIC PANEL
BUN/Creatinine Ratio: 10 (ref 9–20)
BUN: 11 mg/dL (ref 6–24)
CO2: 23 mmol/L (ref 20–29)
Calcium: 9.8 mg/dL (ref 8.7–10.2)
Chloride: 102 mmol/L (ref 96–106)
Creatinine, Ser: 1.11 mg/dL (ref 0.76–1.27)
Glucose: 80 mg/dL (ref 70–99)
Potassium: 4.3 mmol/L (ref 3.5–5.2)
Sodium: 142 mmol/L (ref 134–144)
eGFR: 84 mL/min/{1.73_m2} (ref >59)

## 2022-11-11 LAB — LIPID PANEL
Chol/HDL Ratio: 4.9 ratio (ref 0.0–5.0)
Cholesterol, Total: 192 mg/dL (ref 100–199)
HDL: 39 mg/dL — ABNORMAL LOW (ref >39)
LDL Chol Calc (NIH): 112 mg/dL — ABNORMAL HIGH (ref 0–99)
Triglycerides: 235 mg/dL — ABNORMAL HIGH (ref 0–149)
VLDL Cholesterol Cal: 41 mg/dL — ABNORMAL HIGH (ref 5–40)

## 2022-11-11 LAB — HEPATIC FUNCTION PANEL
ALT: 18 IU/L (ref 0–44)
AST: 17 IU/L (ref 0–40)
Albumin: 4.7 g/dL (ref 4.1–5.1)
Alkaline Phosphatase: 84 IU/L (ref 44–121)
Bilirubin Total: 0.3 mg/dL (ref 0.0–1.2)
Bilirubin, Direct: <0.1 mg/dL (ref 0.00–0.40)
Total Protein: 7 g/dL (ref 6.0–8.5)

## 2022-11-11 LAB — HIV ANTIBODY (ROUTINE TESTING W REFLEX): HIV Screen 4th Generation wRfx: NONREACTIVE

## 2022-11-11 LAB — HEMOGLOBIN A1C
Est. average glucose Bld gHb Est-mCnc: 100 mg/dL
Hgb A1c MFr Bld: 5.1 % (ref 4.8–5.6)

## 2022-11-11 LAB — HEPATITIS C ANTIBODY: Hep C Virus Ab: NONREACTIVE

## 2023-11-17 ENCOUNTER — Ambulatory Visit (INDEPENDENT_AMBULATORY_CARE_PROVIDER_SITE_OTHER): Payer: No Typology Code available for payment source | Admitting: Family Medicine

## 2023-11-17 VITALS — BP 130/80 | HR 55 | Temp 98.4°F | Ht 67.91 in | Wt 162.6 lb

## 2023-11-17 DIAGNOSIS — M25561 Pain in right knee: Secondary | ICD-10-CM

## 2023-11-17 DIAGNOSIS — Z0001 Encounter for general adult medical examination with abnormal findings: Secondary | ICD-10-CM

## 2023-11-17 DIAGNOSIS — E785 Hyperlipidemia, unspecified: Secondary | ICD-10-CM

## 2023-11-17 DIAGNOSIS — Z0000 Encounter for general adult medical examination without abnormal findings: Secondary | ICD-10-CM

## 2023-11-17 NOTE — Progress Notes (Signed)
Subjective:    Patient ID: Christopher Riggs, male    DOB: June 14, 1976, 47 y.o.   MRN: [id number]  HPI The patient comes in today for a wellness visit.  Discussed the use of AI scribe software for clinical note transcription with the patient, who gave verbal consent to proceed.  History of Present Illness   The patient, who maintains an active lifestyle and healthy diet, presented with a primary complaint of persistent knee discomfort. This discomfort has been present for approximately six months and has gradually worsened over time. The patient denies any specific injury to the knee, indicating that the discomfort began insidiously. The discomfort is described as an aching sensation, primarily located on the inside of the knee. The patient reports that the discomfort is most noticeable at night, often preventing him from returning to sleep. However, the discomfort is not described as a stabbing pain, but more of an ache.  During the day, the discomfort is less noticeable unless the patient has been in a stationary position for an extended period, such as sitting or kneeling. Upon movement, the discomfort reportedly subsides. The patient has been managing the discomfort with over-the-counter analgesics such as Tylenol and ibuprofen, primarily at night when the discomfort is most severe.  The patient leads an active lifestyle, engaging in activities such as hiking and kayaking. He also maintains a healthy diet, growing and consuming his own vegetables. The patient denies any smoking history and reports only occasional alcohol use. The patient also reports good sleep habits, typically getting seven to eight hours of sleep per night.  The patient has a history of knee discomfort, having reported similar symptoms in the left knee approximately two to three years ago. However, the discomfort in the left knee gradually resolved on its own. The patient denies any family history of early heart disease or  cancers. He is not currently taking any daily medications, opting instead to maintain health through diet and exercise.       A review of their health history was completed.  A review of medications was also completed.  Any needed refills; N/A  Eating habits: Good  Falls/  MVA accidents in past few months: No  Regular exercise: Yes, walking  Specialist pt sees on regular basis: No  Preventative health issues were discussed.   Additional concerns: Right knee pain, ache feeling    Review of Systems     Objective:   Physical Exam General-in no acute distress Eyes-no discharge Lungs-respiratory rate normal, CTA CV-no murmurs,RRR Extremities skin warm dry no edema Neuro grossly normal Behavior normal, alert Physical Exam   HEENT: External auditory canals clear, tympanic membranes normal. Oral cavity without lesions, normal dentition. NECK: Without lumps or bumps. CHEST: Lungs clear to auscultation bilaterally. CARDIOVASCULAR: Heart sounds normal. ABDOMEN: Soft, non-tender on palpation. MUSCULOSKELETAL: Left knee without pain on palpation or manipulation.     Ligaments and cartilage appear intact on physical exam       Assessment & Plan:  1. Well adult exam Adult wellness-complete.wellness physical was conducted today. Importance of diet and exercise were discussed in detail.  Importance of stress reduction and healthy living were discussed.  In addition to this a discussion regarding safety was also covered.  We also reviewed over immunizations and gave recommendations regarding current immunization needed for age.   In addition to this additional areas were also touched on including: Preventative health exams needed:  Colonoscopy I have encouraged patient to go ahead and get a colonoscopy he  will check with his insurance company to let us know if it is covered Alternatives would be stool checking for blood on a yearly basis if insurance does not cover  colonoscopy  Patient was advised yearly wellness exam  - CBC with Differential - CMP14+EGFR  2. Right knee pain, unspecified chronicity Gentle range of motion, Tylenol as needed, ibuprofen when necessary, if progressive troubles over the next few weeks notify us and we will set up with orthopedics - DG Knee Complete 4 Views Right  3. Hyperlipidemia, unspecified hyperlipidemia type Check lipid profile - Lipid Panel

## 2023-11-18 LAB — CBC WITH DIFFERENTIAL/PLATELET
Basophils Absolute: 0.1 10*3/uL (ref 0.0–0.2)
Basos: 1 %
EOS (ABSOLUTE): 0.1 10*3/uL (ref 0.0–0.4)
Eos: 1 %
Hematocrit: 52.5 % — ABNORMAL HIGH (ref 37.5–51.0)
Hemoglobin: 17.7 g/dL (ref 13.0–17.7)
Immature Grans (Abs): 0 10*3/uL (ref 0.0–0.1)
Immature Granulocytes: 0 %
Lymphocytes Absolute: 2.8 10*3/uL (ref 0.7–3.1)
Lymphs: 46 %
MCH: 30.6 pg (ref 26.6–33.0)
MCHC: 33.7 g/dL (ref 31.5–35.7)
MCV: 91 fL (ref 79–97)
Monocytes Absolute: 0.6 10*3/uL (ref 0.1–0.9)
Monocytes: 9 %
Neutrophils Absolute: 2.7 10*3/uL (ref 1.4–7.0)
Neutrophils: 43 %
Platelets: 303 10*3/uL (ref 150–450)
RBC: 5.78 x10E6/uL (ref 4.14–5.80)
RDW: 13.1 % (ref 11.6–15.4)
WBC: 6.2 10*3/uL (ref 3.4–10.8)

## 2023-11-18 LAB — LIPID PANEL
Chol/HDL Ratio: 5.4 {ratio} — ABNORMAL HIGH (ref 0.0–5.0)
Cholesterol, Total: 222 mg/dL — ABNORMAL HIGH (ref 100–199)
HDL: 41 mg/dL (ref >39)
LDL Chol Calc (NIH): 138 mg/dL — ABNORMAL HIGH (ref 0–99)
Triglycerides: 241 mg/dL — ABNORMAL HIGH (ref 0–149)
VLDL Cholesterol Cal: 43 mg/dL — ABNORMAL HIGH (ref 5–40)

## 2023-11-18 LAB — CMP14+EGFR
ALT: 25 [IU]/L (ref 0–44)
AST: 23 [IU]/L (ref 0–40)
Albumin: 4.7 g/dL (ref 4.1–5.1)
Alkaline Phosphatase: 80 [IU]/L (ref 44–121)
BUN/Creatinine Ratio: 10 (ref 9–20)
BUN: 10 mg/dL (ref 6–24)
Bilirubin Total: 0.4 mg/dL (ref 0.0–1.2)
CO2: 23 mmol/L (ref 20–29)
Calcium: 9.8 mg/dL (ref 8.7–10.2)
Chloride: 102 mmol/L (ref 96–106)
Creatinine, Ser: 0.96 mg/dL (ref 0.76–1.27)
Globulin, Total: 2.6 g/dL (ref 1.5–4.5)
Glucose: 90 mg/dL (ref 70–99)
Potassium: 4.6 mmol/L (ref 3.5–5.2)
Sodium: 140 mmol/L (ref 134–144)
Total Protein: 7.3 g/dL (ref 6.0–8.5)
eGFR: 99 mL/min/{1.73_m2} (ref >59)

## 2024-11-02 ENCOUNTER — Other Ambulatory Visit: Payer: Self-pay

## 2024-11-02 ENCOUNTER — Telehealth: Payer: Self-pay

## 2024-11-02 DIAGNOSIS — Z0000 Encounter for general adult medical examination without abnormal findings: Secondary | ICD-10-CM

## 2024-11-02 DIAGNOSIS — E785 Hyperlipidemia, unspecified: Secondary | ICD-10-CM

## 2024-11-02 NOTE — Telephone Encounter (Signed)
 Copied from CRM [id number]. Topic: Clinical - Request for Lab/Test Order >> Nov 02, 2024  9:53 AM Avram MATSU wrote: Reason for CRM: labs for CPE   ----------------------------------------------------------------------- From previous Reason for Contact - Lab/Test Results: Reason for CRM:    ----------------------------------------------------------------------- From previous Reason for Contact - Scheduling: Patient/patient representative is calling to schedule an appointment. Refer to attachments for appointment information.

## 2024-11-02 NOTE — Telephone Encounter (Signed)
 I recommend lipid, lipoprotein a, CMP with estimated GFR, CBC  Diagnosis wellness, hyperlipidemia, elevated hemogram

## 2024-11-29 ENCOUNTER — Ambulatory Visit: Admitting: Family Medicine

## 2024-11-29 VITALS — BP 132/82 | Ht 66.5 in | Wt 175.6 lb

## 2024-11-29 DIAGNOSIS — Z0001 Encounter for general adult medical examination with abnormal findings: Secondary | ICD-10-CM | POA: Diagnosis not present

## 2024-11-29 DIAGNOSIS — E785 Hyperlipidemia, unspecified: Secondary | ICD-10-CM | POA: Diagnosis not present

## 2024-11-29 DIAGNOSIS — Z0000 Encounter for general adult medical examination without abnormal findings: Secondary | ICD-10-CM

## 2024-11-29 NOTE — Progress Notes (Signed)
 Subjective:    Patient ID: Christopher Riggs, male    DOB: 1976/04/28, 48 y.o.   MRN: [id number]  HPI The patient comes in today for a wellness visit.  Advised to get flu shot patient defers  A review of their health history was completed.  A review of medications was also completed.  Any needed refills;  none needed  Eating habits: Healthy eating habits  Falls/  MVA accidents in past few months: No accidents or injuries  Regular exercise: Stays physically active  Specialist pt sees on regular basis: None  Preventative health issues were discussed.   Additional concerns: None  Results for orders placed or performed in visit on 11/17/23  CBC with Differential   Collection Time: 11/17/23  9:12 AM  Result Value Ref Range   WBC 6.2 3.4 - 10.8 x10E3/uL   RBC 5.78 4.14 - 5.80 x10E6/uL   Hemoglobin 17.7 13.0 - 17.7 g/dL   Hematocrit 47.4 (H) 62.4 - 51.0 %   MCV 91 79 - 97 fL   MCH 30.6 26.6 - 33.0 pg   MCHC 33.7 31.5 - 35.7 g/dL   RDW 86.8 88.3 - 84.5 %   Platelets 303 150 - 450 x10E3/uL   Neutrophils 43 Not Estab. %   Lymphs 46 Not Estab. %   Monocytes 9 Not Estab. %   Eos 1 Not Estab. %   Basos 1 Not Estab. %   Neutrophils Absolute 2.7 1.4 - 7.0 x10E3/uL   Lymphocytes Absolute 2.8 0.7 - 3.1 x10E3/uL   Monocytes Absolute 0.6 0.1 - 0.9 x10E3/uL   EOS (ABSOLUTE) 0.1 0.0 - 0.4 x10E3/uL   Basophils Absolute 0.1 0.0 - 0.2 x10E3/uL   Immature Granulocytes 0 Not Estab. %   Immature Grans (Abs) 0.0 0.0 - 0.1 x10E3/uL  CMP14+EGFR   Collection Time: 11/17/23  9:12 AM  Result Value Ref Range   Glucose 90 70 - 99 mg/dL   BUN 10 6 - 24 mg/dL   Creatinine, Ser 9.03 0.76 - 1.27 mg/dL   eGFR 99 >40 fO/fpw/8.26   BUN/Creatinine Ratio 10 9 - 20   Sodium 140 134 - 144 mmol/L   Potassium 4.6 3.5 - 5.2 mmol/L   Chloride 102 96 - 106 mmol/L   CO2 23 20 - 29 mmol/L   Calcium 9.8 8.7 - 10.2 mg/dL   Total Protein 7.3 6.0 - 8.5 g/dL   Albumin 4.7 4.1 - 5.1 g/dL   Globulin, Total  2.6 1.5 - 4.5 g/dL   Bilirubin Total 0.4 0.0 - 1.2 mg/dL   Alkaline Phosphatase 80 44 - 121 IU/L   AST 23 0 - 40 IU/L   ALT 25 0 - 44 IU/L  Lipid Panel   Collection Time: 11/17/23  9:12 AM  Result Value Ref Range   Cholesterol, Total 222 (H) 100 - 199 mg/dL   Triglycerides 758 (H) 0 - 149 mg/dL   HDL 41 >60 mg/dL   VLDL Cholesterol Cal 43 (H) 5 - 40 mg/dL   LDL Chol Calc (NIH) 861 (H) 0 - 99 mg/dL   Chol/HDL Ratio 5.4 (H) 0.0 - 5.0 ratio   This very nice gentleman Does not smoke Occasionally drinks Stays physically active eats healthy Last years reviewed he will do this years within the week Review of Systems     Objective:   Physical Exam  General-in no acute distress Eyes-no discharge Lungs-respiratory rate normal, CTA CV-no murmurs,RRR Extremities skin warm dry no edema Neuro grossly normal Behavior normal, alert  Neck no masses      Assessment & Plan:  1. Well adult exam (Primary) Adult wellness-complete.wellness physical was conducted today. Importance of diet and exercise were discussed in detail.  Importance of stress reduction and healthy living were discussed.  In addition to this a discussion regarding safety was also covered.  We also reviewed over immunizations and gave recommendations regarding current immunization needed for age.   In addition to this additional areas were also touched on including: Preventative health exams needed:  Colonoscopy patient was advised to get colonoscopy he states his workplace does not cover it therefore he has to wait till age 86 by his choice-this is understood  Patient was advised yearly wellness exam   2. Hyperlipidemia, unspecified hyperlipidemia type Follow this on a yearly basis no statin currently  Yearly wellness recommended

## 2024-11-30 ENCOUNTER — Ambulatory Visit: Payer: Self-pay | Admitting: Family Medicine

## 2024-11-30 LAB — CBC WITH DIFFERENTIAL/PLATELET
Basophils Absolute: 0.1 x10E3/uL (ref 0.0–0.2)
Basos: 1 %
EOS (ABSOLUTE): 0.1 x10E3/uL (ref 0.0–0.4)
Eos: 1 %
Hematocrit: 48.9 % (ref 37.5–51.0)
Hemoglobin: 16.4 g/dL (ref 13.0–17.7)
Immature Grans (Abs): 0 x10E3/uL (ref 0.0–0.1)
Immature Granulocytes: 0 %
Lymphocytes Absolute: 3.7 x10E3/uL — ABNORMAL HIGH (ref 0.7–3.1)
Lymphs: 50 %
MCH: 30.4 pg (ref 26.6–33.0)
MCHC: 33.5 g/dL (ref 31.5–35.7)
MCV: 91 fL (ref 79–97)
Monocytes Absolute: 0.6 x10E3/uL (ref 0.1–0.9)
Monocytes: 8 %
Neutrophils Absolute: 2.9 x10E3/uL (ref 1.4–7.0)
Neutrophils: 40 %
Platelets: 343 x10E3/uL (ref 150–450)
RBC: 5.39 x10E6/uL (ref 4.14–5.80)
RDW: 13.4 % (ref 11.6–15.4)
WBC: 7.4 x10E3/uL (ref 3.4–10.8)

## 2024-11-30 LAB — COMPREHENSIVE METABOLIC PANEL WITH GFR
ALT: 46 IU/L — ABNORMAL HIGH (ref 0–44)
AST: 35 IU/L (ref 0–40)
Albumin: 4.5 g/dL (ref 4.1–5.1)
Alkaline Phosphatase: 77 IU/L (ref 47–123)
BUN/Creatinine Ratio: 10 (ref 9–20)
BUN: 11 mg/dL (ref 6–24)
Bilirubin Total: 0.3 mg/dL (ref 0.0–1.2)
CO2: 25 mmol/L (ref 20–29)
Calcium: 10 mg/dL (ref 8.7–10.2)
Chloride: 103 mmol/L (ref 96–106)
Creatinine, Ser: 1.12 mg/dL (ref 0.76–1.27)
Globulin, Total: 2.6 g/dL (ref 1.5–4.5)
Glucose: 91 mg/dL (ref 70–99)
Potassium: 4.6 mmol/L (ref 3.5–5.2)
Sodium: 141 mmol/L (ref 134–144)
Total Protein: 7.1 g/dL (ref 6.0–8.5)
eGFR: 82 mL/min/1.73 (ref >59)

## 2024-11-30 LAB — LIPID PANEL
Chol/HDL Ratio: 4.9 ratio (ref 0.0–5.0)
Cholesterol, Total: 197 mg/dL (ref 100–199)
HDL: 40 mg/dL (ref >39)
LDL Chol Calc (NIH): 99 mg/dL (ref 0–99)
Triglycerides: 345 mg/dL — ABNORMAL HIGH (ref 0–149)
VLDL Cholesterol Cal: 58 mg/dL — ABNORMAL HIGH (ref 5–40)

## 2024-11-30 LAB — LIPOPROTEIN A (LPA): Lipoprotein (a): 44.6 nmol/L (ref <75.0)

## 2025-01-04 ENCOUNTER — Encounter: Admitting: Family Medicine
